# Patient Record
Sex: Female | Born: 1950
Health system: Southern US, Community
[De-identification: ages and names within clinical notes are randomized; demographics above are authoritative.]

## PROBLEM LIST (undated history)

## (undated) HISTORY — PX: ABDOMINAL HYSTERECTOMY: SHX81

---

## 1997-12-30 ENCOUNTER — Ambulatory Visit (HOSPITAL_COMMUNITY): Admission: RE | Admit: 1997-12-30 | Discharge: 1997-12-30 | Payer: Self-pay | Admitting: Obstetrics and Gynecology

## 1997-12-30 ENCOUNTER — Encounter: Payer: Self-pay | Admitting: Internal Medicine

## 1999-02-18 ENCOUNTER — Ambulatory Visit (HOSPITAL_COMMUNITY): Admission: RE | Admit: 1999-02-18 | Discharge: 1999-02-18 | Payer: Self-pay | Admitting: Internal Medicine

## 1999-02-18 ENCOUNTER — Encounter: Payer: Self-pay | Admitting: Internal Medicine

## 2000-08-19 ENCOUNTER — Ambulatory Visit (HOSPITAL_COMMUNITY): Admission: RE | Admit: 2000-08-19 | Discharge: 2000-08-19 | Payer: Self-pay | Admitting: General Surgery

## 2000-09-22 ENCOUNTER — Ambulatory Visit (HOSPITAL_COMMUNITY): Admission: RE | Admit: 2000-09-22 | Discharge: 2000-09-22 | Payer: Self-pay | Admitting: Unknown Physician Specialty

## 2000-09-22 ENCOUNTER — Encounter: Payer: Self-pay | Admitting: Unknown Physician Specialty

## 2004-07-30 ENCOUNTER — Ambulatory Visit (HOSPITAL_COMMUNITY): Admission: RE | Admit: 2004-07-30 | Discharge: 2004-07-30 | Payer: Self-pay | Admitting: Internal Medicine

## 2004-08-07 ENCOUNTER — Encounter: Admission: RE | Admit: 2004-08-07 | Discharge: 2004-08-07 | Payer: Self-pay | Admitting: *Deleted

## 2005-02-10 ENCOUNTER — Encounter: Admission: RE | Admit: 2005-02-10 | Discharge: 2005-02-10 | Payer: Self-pay | Admitting: *Deleted

## 2006-02-24 ENCOUNTER — Ambulatory Visit (HOSPITAL_COMMUNITY): Admission: RE | Admit: 2006-02-24 | Discharge: 2006-02-24 | Payer: Self-pay | Admitting: *Deleted

## 2007-03-22 ENCOUNTER — Ambulatory Visit (HOSPITAL_COMMUNITY): Admission: RE | Admit: 2007-03-22 | Discharge: 2007-03-22 | Payer: Self-pay | Admitting: *Deleted

## 2008-10-25 ENCOUNTER — Ambulatory Visit (HOSPITAL_COMMUNITY): Admission: RE | Admit: 2008-10-25 | Discharge: 2008-10-25 | Payer: Self-pay | Admitting: Family Medicine

## 2008-11-01 ENCOUNTER — Encounter: Admission: RE | Admit: 2008-11-01 | Discharge: 2008-11-01 | Payer: Self-pay | Admitting: Family Medicine

## 2009-11-25 ENCOUNTER — Encounter: Admission: RE | Admit: 2009-11-25 | Discharge: 2009-11-25 | Payer: Self-pay | Admitting: Neurosurgery

## 2010-01-02 ENCOUNTER — Ambulatory Visit (HOSPITAL_COMMUNITY)
Admission: RE | Admit: 2010-01-02 | Discharge: 2010-01-03 | Payer: Self-pay | Source: Home / Self Care | Attending: Neurosurgery | Admitting: Neurosurgery

## 2010-03-31 LAB — CBC
HCT: 41.9 % (ref 36.0–46.0)
Hemoglobin: 14 g/dL (ref 12.0–15.0)
MCH: 29.7 pg (ref 26.0–34.0)
MCV: 89 fL (ref 78.0–100.0)
WBC: 11.1 10*3/uL — ABNORMAL HIGH (ref 4.0–10.5)

## 2010-03-31 LAB — BASIC METABOLIC PANEL
BUN: 9 mg/dL (ref 6–23)
Calcium: 9.5 mg/dL (ref 8.4–10.5)
Creatinine, Ser: 0.91 mg/dL (ref 0.4–1.2)
GFR calc Af Amer: >60 mL/min (ref >60)

## 2011-02-24 ENCOUNTER — Other Ambulatory Visit: Payer: Self-pay | Admitting: Family Medicine

## 2011-02-24 DIAGNOSIS — Z1231 Encounter for screening mammogram for malignant neoplasm of breast: Secondary | ICD-10-CM

## 2011-03-17 ENCOUNTER — Ambulatory Visit
Admission: RE | Admit: 2011-03-17 | Discharge: 2011-03-17 | Disposition: A | Discharge status: Home / Self Care | Payer: BC Managed Care – PPO | Source: Ambulatory Visit | Attending: Family Medicine | Admitting: Family Medicine

## 2011-03-17 DIAGNOSIS — Z1231 Encounter for screening mammogram for malignant neoplasm of breast: Secondary | ICD-10-CM

## 2012-06-20 ENCOUNTER — Other Ambulatory Visit: Payer: Self-pay

## 2012-06-20 DIAGNOSIS — Z1231 Encounter for screening mammogram for malignant neoplasm of breast: Secondary | ICD-10-CM

## 2012-07-18 ENCOUNTER — Ambulatory Visit
Admission: RE | Admit: 2012-07-18 | Discharge: 2012-07-18 | Disposition: A | Discharge status: Home / Self Care | Payer: BC Managed Care – PPO | Source: Ambulatory Visit

## 2012-07-18 DIAGNOSIS — Z1231 Encounter for screening mammogram for malignant neoplasm of breast: Secondary | ICD-10-CM

## 2013-08-13 ENCOUNTER — Other Ambulatory Visit (HOSPITAL_COMMUNITY): Payer: Self-pay | Admitting: Family Medicine

## 2013-08-13 DIAGNOSIS — Z139 Encounter for screening, unspecified: Secondary | ICD-10-CM

## 2013-08-15 ENCOUNTER — Ambulatory Visit (HOSPITAL_COMMUNITY)
Admission: RE | Admit: 2013-08-15 | Discharge: 2013-08-15 | Disposition: A | Discharge status: Home / Self Care | Payer: BC Managed Care – PPO | Source: Ambulatory Visit | Attending: Family Medicine | Admitting: Family Medicine

## 2013-08-15 DIAGNOSIS — Z139 Encounter for screening, unspecified: Secondary | ICD-10-CM

## 2013-08-15 DIAGNOSIS — Z1231 Encounter for screening mammogram for malignant neoplasm of breast: Secondary | ICD-10-CM | POA: Insufficient documentation

## 2015-04-30 DIAGNOSIS — N183 Chronic kidney disease, stage 3 (moderate): Secondary | ICD-10-CM | POA: Diagnosis not present

## 2015-04-30 DIAGNOSIS — G4733 Obstructive sleep apnea (adult) (pediatric): Secondary | ICD-10-CM | POA: Diagnosis not present

## 2015-04-30 DIAGNOSIS — I10 Essential (primary) hypertension: Secondary | ICD-10-CM | POA: Diagnosis not present

## 2015-04-30 DIAGNOSIS — E782 Mixed hyperlipidemia: Secondary | ICD-10-CM | POA: Diagnosis not present

## 2015-05-06 DIAGNOSIS — G4733 Obstructive sleep apnea (adult) (pediatric): Secondary | ICD-10-CM | POA: Diagnosis not present

## 2015-05-06 DIAGNOSIS — E782 Mixed hyperlipidemia: Secondary | ICD-10-CM | POA: Diagnosis not present

## 2015-05-06 DIAGNOSIS — I10 Essential (primary) hypertension: Secondary | ICD-10-CM | POA: Diagnosis not present

## 2015-05-06 DIAGNOSIS — N183 Chronic kidney disease, stage 3 (moderate): Secondary | ICD-10-CM | POA: Diagnosis not present

## 2015-05-08 DIAGNOSIS — Z13820 Encounter for screening for osteoporosis: Secondary | ICD-10-CM | POA: Diagnosis not present

## 2015-05-08 DIAGNOSIS — Z79899 Other long term (current) drug therapy: Secondary | ICD-10-CM | POA: Diagnosis not present

## 2015-05-08 DIAGNOSIS — Z780 Asymptomatic menopausal state: Secondary | ICD-10-CM | POA: Diagnosis not present

## 2015-05-08 DIAGNOSIS — I10 Essential (primary) hypertension: Secondary | ICD-10-CM | POA: Diagnosis not present

## 2015-05-08 DIAGNOSIS — M810 Age-related osteoporosis without current pathological fracture: Secondary | ICD-10-CM | POA: Diagnosis not present

## 2015-05-09 DIAGNOSIS — Z1231 Encounter for screening mammogram for malignant neoplasm of breast: Secondary | ICD-10-CM | POA: Diagnosis not present

## 2015-05-30 DIAGNOSIS — I10 Essential (primary) hypertension: Secondary | ICD-10-CM | POA: Diagnosis not present

## 2015-05-30 DIAGNOSIS — R7301 Impaired fasting glucose: Secondary | ICD-10-CM | POA: Diagnosis not present

## 2015-05-30 DIAGNOSIS — E782 Mixed hyperlipidemia: Secondary | ICD-10-CM | POA: Diagnosis not present

## 2015-05-30 DIAGNOSIS — N183 Chronic kidney disease, stage 3 (moderate): Secondary | ICD-10-CM | POA: Diagnosis not present

## 2015-06-05 DIAGNOSIS — Z0001 Encounter for general adult medical examination with abnormal findings: Secondary | ICD-10-CM | POA: Diagnosis not present

## 2015-06-05 DIAGNOSIS — Z1212 Encounter for screening for malignant neoplasm of rectum: Secondary | ICD-10-CM | POA: Diagnosis not present

## 2015-06-05 DIAGNOSIS — I10 Essential (primary) hypertension: Secondary | ICD-10-CM | POA: Diagnosis not present

## 2015-06-05 DIAGNOSIS — Z23 Encounter for immunization: Secondary | ICD-10-CM | POA: Diagnosis not present

## 2015-06-05 DIAGNOSIS — E782 Mixed hyperlipidemia: Secondary | ICD-10-CM | POA: Diagnosis not present

## 2015-06-05 DIAGNOSIS — G4733 Obstructive sleep apnea (adult) (pediatric): Secondary | ICD-10-CM | POA: Diagnosis not present

## 2015-11-26 DIAGNOSIS — Z23 Encounter for immunization: Secondary | ICD-10-CM | POA: Diagnosis not present

## 2015-11-26 DIAGNOSIS — S5002XA Contusion of left elbow, initial encounter: Secondary | ICD-10-CM | POA: Diagnosis not present

## 2015-11-26 DIAGNOSIS — S52042A Displaced fracture of coronoid process of left ulna, initial encounter for closed fracture: Secondary | ICD-10-CM | POA: Diagnosis not present

## 2015-11-26 DIAGNOSIS — S52022A Displaced fracture of olecranon process without intraarticular extension of left ulna, initial encounter for closed fracture: Secondary | ICD-10-CM | POA: Diagnosis not present

## 2015-12-02 DIAGNOSIS — S52045A Nondisplaced fracture of coronoid process of left ulna, initial encounter for closed fracture: Secondary | ICD-10-CM | POA: Diagnosis not present

## 2016-04-26 DIAGNOSIS — Z23 Encounter for immunization: Secondary | ICD-10-CM | POA: Diagnosis not present

## 2016-04-26 DIAGNOSIS — N183 Chronic kidney disease, stage 3 (moderate): Secondary | ICD-10-CM | POA: Diagnosis not present

## 2016-04-26 DIAGNOSIS — I10 Essential (primary) hypertension: Secondary | ICD-10-CM | POA: Diagnosis not present

## 2016-04-26 DIAGNOSIS — G4733 Obstructive sleep apnea (adult) (pediatric): Secondary | ICD-10-CM | POA: Diagnosis not present

## 2016-04-26 DIAGNOSIS — M545 Low back pain: Secondary | ICD-10-CM | POA: Diagnosis not present

## 2016-04-26 DIAGNOSIS — E782 Mixed hyperlipidemia: Secondary | ICD-10-CM | POA: Diagnosis not present

## 2016-06-01 DIAGNOSIS — C44511 Basal cell carcinoma of skin of breast: Secondary | ICD-10-CM | POA: Diagnosis not present

## 2016-06-01 DIAGNOSIS — C44509 Unspecified malignant neoplasm of skin of other part of trunk: Secondary | ICD-10-CM | POA: Diagnosis not present

## 2016-07-20 DIAGNOSIS — R7301 Impaired fasting glucose: Secondary | ICD-10-CM | POA: Diagnosis not present

## 2016-07-20 DIAGNOSIS — N183 Chronic kidney disease, stage 3 (moderate): Secondary | ICD-10-CM | POA: Diagnosis not present

## 2016-07-20 DIAGNOSIS — M545 Low back pain: Secondary | ICD-10-CM | POA: Diagnosis not present

## 2016-07-20 DIAGNOSIS — E782 Mixed hyperlipidemia: Secondary | ICD-10-CM | POA: Diagnosis not present

## 2016-07-20 DIAGNOSIS — Z1389 Encounter for screening for other disorder: Secondary | ICD-10-CM | POA: Diagnosis not present

## 2016-07-20 DIAGNOSIS — I10 Essential (primary) hypertension: Secondary | ICD-10-CM | POA: Diagnosis not present

## 2016-07-29 DIAGNOSIS — Z1231 Encounter for screening mammogram for malignant neoplasm of breast: Secondary | ICD-10-CM | POA: Diagnosis not present

## 2016-11-08 DIAGNOSIS — N183 Chronic kidney disease, stage 3 (moderate): Secondary | ICD-10-CM | POA: Diagnosis not present

## 2016-11-08 DIAGNOSIS — E782 Mixed hyperlipidemia: Secondary | ICD-10-CM | POA: Diagnosis not present

## 2016-11-08 DIAGNOSIS — Z0001 Encounter for general adult medical examination with abnormal findings: Secondary | ICD-10-CM | POA: Diagnosis not present

## 2016-11-08 DIAGNOSIS — M545 Low back pain: Secondary | ICD-10-CM | POA: Diagnosis not present

## 2016-11-08 DIAGNOSIS — Z9189 Other specified personal risk factors, not elsewhere classified: Secondary | ICD-10-CM | POA: Diagnosis not present

## 2016-11-08 DIAGNOSIS — Z23 Encounter for immunization: Secondary | ICD-10-CM | POA: Diagnosis not present

## 2017-06-20 DIAGNOSIS — R7301 Impaired fasting glucose: Secondary | ICD-10-CM | POA: Diagnosis not present

## 2017-06-20 DIAGNOSIS — N183 Chronic kidney disease, stage 3 (moderate): Secondary | ICD-10-CM | POA: Diagnosis not present

## 2017-06-20 DIAGNOSIS — E782 Mixed hyperlipidemia: Secondary | ICD-10-CM | POA: Diagnosis not present

## 2017-06-20 DIAGNOSIS — Z79891 Long term (current) use of opiate analgesic: Secondary | ICD-10-CM | POA: Diagnosis not present

## 2017-06-20 DIAGNOSIS — I10 Essential (primary) hypertension: Secondary | ICD-10-CM | POA: Diagnosis not present

## 2017-06-20 DIAGNOSIS — Z1389 Encounter for screening for other disorder: Secondary | ICD-10-CM | POA: Diagnosis not present

## 2017-06-21 DIAGNOSIS — Z79891 Long term (current) use of opiate analgesic: Secondary | ICD-10-CM | POA: Diagnosis not present

## 2017-11-11 DIAGNOSIS — R7301 Impaired fasting glucose: Secondary | ICD-10-CM | POA: Diagnosis not present

## 2017-11-11 DIAGNOSIS — I10 Essential (primary) hypertension: Secondary | ICD-10-CM | POA: Diagnosis not present

## 2017-11-11 DIAGNOSIS — Z0001 Encounter for general adult medical examination with abnormal findings: Secondary | ICD-10-CM | POA: Diagnosis not present

## 2017-11-11 DIAGNOSIS — Z79891 Long term (current) use of opiate analgesic: Secondary | ICD-10-CM | POA: Diagnosis not present

## 2017-11-11 DIAGNOSIS — E782 Mixed hyperlipidemia: Secondary | ICD-10-CM | POA: Diagnosis not present

## 2017-11-11 DIAGNOSIS — N183 Chronic kidney disease, stage 3 (moderate): Secondary | ICD-10-CM | POA: Diagnosis not present

## 2017-11-14 DIAGNOSIS — Z0001 Encounter for general adult medical examination with abnormal findings: Secondary | ICD-10-CM | POA: Diagnosis not present

## 2017-11-14 DIAGNOSIS — N183 Chronic kidney disease, stage 3 (moderate): Secondary | ICD-10-CM | POA: Diagnosis not present

## 2017-11-14 DIAGNOSIS — I10 Essential (primary) hypertension: Secondary | ICD-10-CM | POA: Diagnosis not present

## 2017-11-14 DIAGNOSIS — E782 Mixed hyperlipidemia: Secondary | ICD-10-CM | POA: Diagnosis not present

## 2017-11-14 DIAGNOSIS — R7301 Impaired fasting glucose: Secondary | ICD-10-CM | POA: Diagnosis not present

## 2017-12-27 ENCOUNTER — Encounter: Payer: Self-pay | Admitting: General Surgery

## 2017-12-27 ENCOUNTER — Ambulatory Visit: Payer: Medicare Other | Admitting: General Surgery

## 2017-12-27 VITALS — BP 147/73 | HR 80 | Temp 98.0°F | Resp 18 | Wt 194.8 lb

## 2017-12-27 DIAGNOSIS — Z1211 Encounter for screening for malignant neoplasm of colon: Secondary | ICD-10-CM

## 2017-12-27 MED ORDER — PEG 3350-KCL-NABCB-NACL-NASULF 236 G PO SOLR: 4000.0000 mL | Freq: Once | ORAL | 0 refills | Status: AC

## 2017-12-27 NOTE — Progress Notes (Signed)
Taylor Warner; [id number]; Dec 04, 1950   HPI Patient is a 67 year old white female who was referred to my care by Dr. Quillian Quince for a screening colonoscopy.  Patient has had a colonoscopy in the remote past which polyp.  It was removed.  She has no family history of colon cancer.  She denies any recent abdominal pain, weight loss, diarrhea, constipation, or blood in her stools.  She currently has 0 out of 10 abdominal pain. History reviewed. No pertinent past medical history.  History reviewed. No pertinent surgical history.  History reviewed. No pertinent family history.  Current Outpatient Medications on File Prior to Visit  Medication Sig Dispense Refill  . buPROPion (WELLBUTRIN XL) 300 MG 24 hr tablet Take 300 mg by mouth daily.    . DULoxetine (CYMBALTA) 60 MG capsule Take 60 mg by mouth daily.    Marland Kitchen HYDROcodone-acetaminophen (NORCO/VICODIN) 5-325 MG tablet Take 1 tablet by mouth every 6 (six) hours as needed for moderate pain.    Marland Kitchen lisinopril-hydrochlorothiazide (PRINZIDE,ZESTORETIC) 10-12.5 MG tablet Take 1 tablet by mouth daily.    . traZODone (DESYREL) 50 MG tablet Take 50 mg by mouth at bedtime.     No current facility-administered medications on file prior to visit.     No Known Allergies  Social History   Substance and Sexual Activity  Alcohol Use Not on file    Social History   Tobacco Use  Smoking Status Former Smoker  Smokeless Tobacco Never Used    Review of Systems  Constitutional: Negative.   HENT: Negative.   Eyes: Negative.   Respiratory: Negative.   Cardiovascular: Negative.   Gastrointestinal: Negative.   Genitourinary: Negative.   Musculoskeletal: Negative.   Skin: Negative.   Neurological: Negative.   Endo/Heme/Allergies: Negative.   Psychiatric/Behavioral: Negative.     Objective   Vitals:   12/27/17 1314  BP: (!) 147/73  Pulse: 80  Resp: 18  Temp: 98 F (36.7 C)    Physical Exam  Constitutional: She is oriented to person, place, and  time. She appears well-developed and well-nourished. No distress.  HENT:  Head: Normocephalic and atraumatic.  Cardiovascular: Normal rate, regular rhythm and normal heart sounds. Exam reveals no gallop and no friction rub.  No murmur heard. Pulmonary/Chest: Effort normal and breath sounds normal. No stridor. No respiratory distress. She has no wheezes. She has no rales.  Abdominal: Soft. Bowel sounds are normal. She exhibits no distension and no mass. There is no tenderness. There is no rebound and no guarding.  Neurological: She is oriented to person, place, and time.  Skin: Skin is warm and dry.  Vitals reviewed. Dr. Olena Heckle notes reviewed  Assessment  Need for screening colonoscopy Plan   Patient will call to schedule her colonoscopy in January of 2020.  The risks and benefits of the procedure including bleeding and perforation were fully explained to the patient, who gave informed consent.

## 2017-12-27 NOTE — Patient Instructions (Signed)
Colonoscopy, Adult A colonoscopy is an exam to look at the entire large intestine. During the exam, a lubricated, bendable tube is inserted into the anus and then passed into the rectum, colon, and other parts of the large intestine. A colonoscopy is often done as a part of normal colorectal screening or in response to certain symptoms, such as anemia, persistent diarrhea, abdominal pain, and blood in the stool. The exam can help screen for and diagnose medical problems, including:  Tumors.  Polyps.  Inflammation.  Areas of bleeding.  Tell a health care provider about:  Any allergies you have.  All medicines you are taking, including vitamins, herbs, eye drops, creams, and over-the-counter medicines.  Any problems you or family members have had with anesthetic medicines.  Any blood disorders you have.  Any surgeries you have had.  Any medical conditions you have.  Any problems you have had passing stool. What are the risks? Generally, this is a safe procedure. However, problems may occur, including:  Bleeding.  A tear in the intestine.  A reaction to medicines given during the exam.  Infection (rare).  What happens before the procedure? Eating and drinking restrictions Follow instructions from your health care provider about eating and drinking, which may include:  A few days before the procedure - follow a low-fiber diet. Avoid nuts, seeds, dried fruit, raw fruits, and vegetables.  1-3 days before the procedure - follow a clear liquid diet. Drink only clear liquids, such as clear broth or bouillon, black coffee or tea, clear juice, clear soft drinks or sports drinks, gelatin dessert, and popsicles. Avoid any liquids that contain red or purple dye.  On the day of the procedure - do not eat or drink anything during the 2 hours before the procedure, or within the time period that your health care provider recommends.  Bowel prep If you were prescribed an oral bowel prep  to clean out your colon:  Take it as told by your health care provider. Starting the day before your procedure, you will need to drink a large amount of medicated liquid. The liquid will cause you to have multiple loose stools until your stool is almost clear or light green.  If your skin or anus gets irritated from diarrhea, you may use these to relieve the irritation: ? Medicated wipes, such as adult wet wipes with aloe and vitamin E. ? A skin soothing-product like petroleum jelly.  If you vomit while drinking the bowel prep, take a break for up to 60 minutes and then begin the bowel prep again. If vomiting continues and you cannot take the bowel prep without vomiting, call your health care provider.  General instructions  Ask your health care provider about changing or stopping your regular medicines. This is especially important if you are taking diabetes medicines or blood thinners.  Plan to have someone take you home from the hospital or clinic. What happens during the procedure?  An IV tube may be inserted into one of your veins.  You will be given medicine to help you relax (sedative).  To reduce your risk of infection: ? Your health care team will wash or sanitize their hands. ? Your anal area will be washed with soap.  You will be asked to lie on your side with your knees bent.  Your health care provider will lubricate a long, thin, flexible tube. The tube will have a camera and a light on the end.  The tube will be inserted into your   anus.  The tube will be gently eased through your rectum and colon.  Air will be delivered into your colon to keep it open. You may feel some pressure or cramping.  The camera will be used to take images during the procedure.  A small tissue sample may be removed from your body to be examined under a microscope (biopsy). If any potential problems are found, the tissue will be sent to a lab for testing.  If small polyps are found, your  health care provider may remove them and have them checked for cancer cells.  The tube that was inserted into your anus will be slowly removed. The procedure may vary among health care providers and hospitals. What happens after the procedure?  Your blood pressure, heart rate, breathing rate, and blood oxygen level will be monitored until the medicines you were given have worn off.  Do not drive for 24 hours after the exam.  You may have a small amount of blood in your stool.  You may pass gas and have mild abdominal cramping or bloating due to the air that was used to inflate your colon during the exam.  It is up to you to get the results of your procedure. Ask your health care provider, or the department performing the procedure, when your results will be ready. This information is not intended to replace advice given to you by your health care provider. Make sure you discuss any questions you have with your health care provider. Document Released: 01/02/2000 Document Revised: 11/05/2015 Document Reviewed: 03/18/2015 Elsevier Interactive Patient Education  2018 [employer]  

## 2018-02-15 DIAGNOSIS — E782 Mixed hyperlipidemia: Secondary | ICD-10-CM | POA: Diagnosis not present

## 2018-02-15 DIAGNOSIS — I10 Essential (primary) hypertension: Secondary | ICD-10-CM | POA: Diagnosis not present

## 2018-02-15 DIAGNOSIS — N183 Chronic kidney disease, stage 3 (moderate): Secondary | ICD-10-CM | POA: Diagnosis not present

## 2018-02-15 DIAGNOSIS — R7301 Impaired fasting glucose: Secondary | ICD-10-CM | POA: Diagnosis not present

## 2018-02-15 DIAGNOSIS — Z1389 Encounter for screening for other disorder: Secondary | ICD-10-CM | POA: Diagnosis not present

## 2018-05-12 DIAGNOSIS — E782 Mixed hyperlipidemia: Secondary | ICD-10-CM | POA: Diagnosis not present

## 2018-05-12 DIAGNOSIS — I10 Essential (primary) hypertension: Secondary | ICD-10-CM | POA: Diagnosis not present

## 2018-05-12 DIAGNOSIS — R7301 Impaired fasting glucose: Secondary | ICD-10-CM | POA: Diagnosis not present

## 2018-05-12 DIAGNOSIS — G4733 Obstructive sleep apnea (adult) (pediatric): Secondary | ICD-10-CM | POA: Diagnosis not present

## 2018-05-16 DIAGNOSIS — E782 Mixed hyperlipidemia: Secondary | ICD-10-CM | POA: Diagnosis not present

## 2018-05-16 DIAGNOSIS — I10 Essential (primary) hypertension: Secondary | ICD-10-CM | POA: Diagnosis not present

## 2018-05-16 DIAGNOSIS — R7301 Impaired fasting glucose: Secondary | ICD-10-CM | POA: Diagnosis not present

## 2018-05-16 DIAGNOSIS — N183 Chronic kidney disease, stage 3 (moderate): Secondary | ICD-10-CM | POA: Diagnosis not present

## 2018-11-14 DIAGNOSIS — E349 Endocrine disorder, unspecified: Secondary | ICD-10-CM | POA: Diagnosis not present

## 2018-11-14 DIAGNOSIS — Z79891 Long term (current) use of opiate analgesic: Secondary | ICD-10-CM | POA: Diagnosis not present

## 2018-11-14 DIAGNOSIS — I10 Essential (primary) hypertension: Secondary | ICD-10-CM | POA: Diagnosis not present

## 2018-11-14 DIAGNOSIS — R7309 Other abnormal glucose: Secondary | ICD-10-CM | POA: Diagnosis not present

## 2018-11-14 DIAGNOSIS — R7301 Impaired fasting glucose: Secondary | ICD-10-CM | POA: Diagnosis not present

## 2018-11-14 DIAGNOSIS — E782 Mixed hyperlipidemia: Secondary | ICD-10-CM | POA: Diagnosis not present

## 2018-11-17 ENCOUNTER — Other Ambulatory Visit (HOSPITAL_COMMUNITY): Payer: Self-pay | Admitting: Family Medicine

## 2018-11-17 DIAGNOSIS — N183 Chronic kidney disease, stage 3 unspecified: Secondary | ICD-10-CM | POA: Diagnosis not present

## 2018-11-17 DIAGNOSIS — Z23 Encounter for immunization: Secondary | ICD-10-CM | POA: Diagnosis not present

## 2018-11-17 DIAGNOSIS — R7301 Impaired fasting glucose: Secondary | ICD-10-CM | POA: Diagnosis not present

## 2018-11-17 DIAGNOSIS — M545 Low back pain: Secondary | ICD-10-CM | POA: Diagnosis not present

## 2018-11-17 DIAGNOSIS — Z0001 Encounter for general adult medical examination with abnormal findings: Secondary | ICD-10-CM | POA: Diagnosis not present

## 2018-11-17 DIAGNOSIS — N189 Chronic kidney disease, unspecified: Secondary | ICD-10-CM | POA: Diagnosis not present

## 2018-11-17 DIAGNOSIS — I10 Essential (primary) hypertension: Secondary | ICD-10-CM | POA: Diagnosis not present

## 2018-11-17 DIAGNOSIS — Z1231 Encounter for screening mammogram for malignant neoplasm of breast: Secondary | ICD-10-CM

## 2018-11-23 DIAGNOSIS — C44319 Basal cell carcinoma of skin of other parts of face: Secondary | ICD-10-CM | POA: Diagnosis not present

## 2018-11-24 DIAGNOSIS — C44311 Basal cell carcinoma of skin of nose: Secondary | ICD-10-CM | POA: Diagnosis not present

## 2018-11-29 ENCOUNTER — Ambulatory Visit (HOSPITAL_COMMUNITY)
Admission: RE | Admit: 2018-11-29 | Discharge: 2018-11-29 | Disposition: A | Discharge status: Home / Self Care | Payer: Medicare Other | Source: Ambulatory Visit | Attending: Family Medicine | Admitting: Family Medicine

## 2018-11-29 ENCOUNTER — Other Ambulatory Visit: Payer: Self-pay

## 2018-11-29 DIAGNOSIS — Z1231 Encounter for screening mammogram for malignant neoplasm of breast: Secondary | ICD-10-CM

## 2018-12-04 DIAGNOSIS — Z1212 Encounter for screening for malignant neoplasm of rectum: Secondary | ICD-10-CM | POA: Diagnosis not present

## 2018-12-04 DIAGNOSIS — Z1211 Encounter for screening for malignant neoplasm of colon: Secondary | ICD-10-CM | POA: Diagnosis not present

## 2018-12-26 ENCOUNTER — Other Ambulatory Visit: Payer: Self-pay

## 2018-12-26 NOTE — Patient Outreach (Signed)
[facility]) Care Management  12/26/2018  Taylor Warner 11/15/1950 [id number]   Medication Adherence call to Taylor Warner Telephone call to Patient regarding Medication Adherence unable to reach patient. Taylor Warner is past due on Lisinopril/Hctz 10/12.5 mg under [facility].    [place] Management Direct Dial [phone]  Fax [phone] [email or web address]

## 2019-01-18 DIAGNOSIS — I10 Essential (primary) hypertension: Secondary | ICD-10-CM | POA: Diagnosis not present

## 2019-01-18 DIAGNOSIS — E782 Mixed hyperlipidemia: Secondary | ICD-10-CM | POA: Diagnosis not present

## 2019-02-16 DIAGNOSIS — G4733 Obstructive sleep apnea (adult) (pediatric): Secondary | ICD-10-CM | POA: Diagnosis not present

## 2019-02-16 DIAGNOSIS — I10 Essential (primary) hypertension: Secondary | ICD-10-CM | POA: Diagnosis not present

## 2019-02-16 DIAGNOSIS — E7849 Other hyperlipidemia: Secondary | ICD-10-CM | POA: Diagnosis not present

## 2019-02-20 DIAGNOSIS — I10 Essential (primary) hypertension: Secondary | ICD-10-CM | POA: Diagnosis not present

## 2019-02-20 DIAGNOSIS — N183 Chronic kidney disease, stage 3 unspecified: Secondary | ICD-10-CM | POA: Diagnosis not present

## 2019-02-20 DIAGNOSIS — R739 Hyperglycemia, unspecified: Secondary | ICD-10-CM | POA: Diagnosis not present

## 2019-02-20 DIAGNOSIS — E782 Mixed hyperlipidemia: Secondary | ICD-10-CM | POA: Diagnosis not present

## 2019-03-16 DIAGNOSIS — I10 Essential (primary) hypertension: Secondary | ICD-10-CM | POA: Diagnosis not present

## 2019-03-16 DIAGNOSIS — E7849 Other hyperlipidemia: Secondary | ICD-10-CM | POA: Diagnosis not present

## 2019-04-18 DIAGNOSIS — I10 Essential (primary) hypertension: Secondary | ICD-10-CM | POA: Diagnosis not present

## 2019-04-18 DIAGNOSIS — E7849 Other hyperlipidemia: Secondary | ICD-10-CM | POA: Diagnosis not present

## 2019-05-18 DIAGNOSIS — E7849 Other hyperlipidemia: Secondary | ICD-10-CM | POA: Diagnosis not present

## 2019-05-18 DIAGNOSIS — I10 Essential (primary) hypertension: Secondary | ICD-10-CM | POA: Diagnosis not present

## 2019-07-05 DIAGNOSIS — I10 Essential (primary) hypertension: Secondary | ICD-10-CM | POA: Diagnosis not present

## 2019-07-05 DIAGNOSIS — N183 Chronic kidney disease, stage 3 unspecified: Secondary | ICD-10-CM | POA: Diagnosis not present

## 2019-07-05 DIAGNOSIS — E782 Mixed hyperlipidemia: Secondary | ICD-10-CM | POA: Diagnosis not present

## 2019-07-05 DIAGNOSIS — R7301 Impaired fasting glucose: Secondary | ICD-10-CM | POA: Diagnosis not present

## 2019-07-10 DIAGNOSIS — E1122 Type 2 diabetes mellitus with diabetic chronic kidney disease: Secondary | ICD-10-CM | POA: Diagnosis not present

## 2019-07-10 DIAGNOSIS — I10 Essential (primary) hypertension: Secondary | ICD-10-CM | POA: Diagnosis not present

## 2019-07-10 DIAGNOSIS — M545 Low back pain: Secondary | ICD-10-CM | POA: Diagnosis not present

## 2019-07-10 DIAGNOSIS — N183 Chronic kidney disease, stage 3 unspecified: Secondary | ICD-10-CM | POA: Diagnosis not present

## 2019-07-10 DIAGNOSIS — Z79891 Long term (current) use of opiate analgesic: Secondary | ICD-10-CM | POA: Diagnosis not present

## 2019-07-10 DIAGNOSIS — Z1389 Encounter for screening for other disorder: Secondary | ICD-10-CM | POA: Diagnosis not present

## 2019-07-10 DIAGNOSIS — E782 Mixed hyperlipidemia: Secondary | ICD-10-CM | POA: Diagnosis not present

## 2019-07-27 DIAGNOSIS — R270 Ataxia, unspecified: Secondary | ICD-10-CM | POA: Diagnosis not present

## 2019-07-27 DIAGNOSIS — R2689 Other abnormalities of gait and mobility: Secondary | ICD-10-CM | POA: Diagnosis not present

## 2019-08-17 DIAGNOSIS — N183 Chronic kidney disease, stage 3 unspecified: Secondary | ICD-10-CM | POA: Diagnosis not present

## 2019-08-17 DIAGNOSIS — I129 Hypertensive chronic kidney disease with stage 1 through stage 4 chronic kidney disease, or unspecified chronic kidney disease: Secondary | ICD-10-CM | POA: Diagnosis not present

## 2019-08-17 DIAGNOSIS — E1122 Type 2 diabetes mellitus with diabetic chronic kidney disease: Secondary | ICD-10-CM | POA: Diagnosis not present

## 2019-08-17 DIAGNOSIS — E7849 Other hyperlipidemia: Secondary | ICD-10-CM | POA: Diagnosis not present

## 2019-10-31 DIAGNOSIS — E782 Mixed hyperlipidemia: Secondary | ICD-10-CM | POA: Diagnosis not present

## 2019-10-31 DIAGNOSIS — I10 Essential (primary) hypertension: Secondary | ICD-10-CM | POA: Diagnosis not present

## 2019-10-31 DIAGNOSIS — E1122 Type 2 diabetes mellitus with diabetic chronic kidney disease: Secondary | ICD-10-CM | POA: Diagnosis not present

## 2019-10-31 DIAGNOSIS — N183 Chronic kidney disease, stage 3 unspecified: Secondary | ICD-10-CM | POA: Diagnosis not present

## 2019-11-05 DIAGNOSIS — E782 Mixed hyperlipidemia: Secondary | ICD-10-CM | POA: Diagnosis not present

## 2019-11-05 DIAGNOSIS — Z0001 Encounter for general adult medical examination with abnormal findings: Secondary | ICD-10-CM | POA: Diagnosis not present

## 2019-11-05 DIAGNOSIS — Z79891 Long term (current) use of opiate analgesic: Secondary | ICD-10-CM | POA: Diagnosis not present

## 2019-11-05 DIAGNOSIS — R4582 Worries: Secondary | ICD-10-CM | POA: Diagnosis not present

## 2019-11-05 DIAGNOSIS — I10 Essential (primary) hypertension: Secondary | ICD-10-CM | POA: Diagnosis not present

## 2020-01-29 DIAGNOSIS — Z1329 Encounter for screening for other suspected endocrine disorder: Secondary | ICD-10-CM | POA: Diagnosis not present

## 2020-01-29 DIAGNOSIS — I10 Essential (primary) hypertension: Secondary | ICD-10-CM | POA: Diagnosis not present

## 2020-01-29 DIAGNOSIS — R7301 Impaired fasting glucose: Secondary | ICD-10-CM | POA: Diagnosis not present

## 2020-01-29 DIAGNOSIS — E1122 Type 2 diabetes mellitus with diabetic chronic kidney disease: Secondary | ICD-10-CM | POA: Diagnosis not present

## 2020-01-29 DIAGNOSIS — E782 Mixed hyperlipidemia: Secondary | ICD-10-CM | POA: Diagnosis not present

## 2020-01-29 DIAGNOSIS — R946 Abnormal results of thyroid function studies: Secondary | ICD-10-CM | POA: Diagnosis not present

## 2020-01-29 DIAGNOSIS — E7849 Other hyperlipidemia: Secondary | ICD-10-CM | POA: Diagnosis not present

## 2020-02-04 DIAGNOSIS — Z79891 Long term (current) use of opiate analgesic: Secondary | ICD-10-CM | POA: Diagnosis not present

## 2020-02-04 DIAGNOSIS — E1122 Type 2 diabetes mellitus with diabetic chronic kidney disease: Secondary | ICD-10-CM | POA: Diagnosis not present

## 2020-02-04 DIAGNOSIS — I10 Essential (primary) hypertension: Secondary | ICD-10-CM | POA: Diagnosis not present

## 2020-02-04 DIAGNOSIS — R4582 Worries: Secondary | ICD-10-CM | POA: Diagnosis not present

## 2020-02-04 DIAGNOSIS — E7849 Other hyperlipidemia: Secondary | ICD-10-CM | POA: Diagnosis not present

## 2020-05-01 DIAGNOSIS — E7849 Other hyperlipidemia: Secondary | ICD-10-CM | POA: Diagnosis not present

## 2020-05-01 DIAGNOSIS — N183 Chronic kidney disease, stage 3 unspecified: Secondary | ICD-10-CM | POA: Diagnosis not present

## 2020-05-01 DIAGNOSIS — I10 Essential (primary) hypertension: Secondary | ICD-10-CM | POA: Diagnosis not present

## 2020-05-01 DIAGNOSIS — E1122 Type 2 diabetes mellitus with diabetic chronic kidney disease: Secondary | ICD-10-CM | POA: Diagnosis not present

## 2020-05-01 DIAGNOSIS — Z1329 Encounter for screening for other suspected endocrine disorder: Secondary | ICD-10-CM | POA: Diagnosis not present

## 2020-05-01 DIAGNOSIS — E782 Mixed hyperlipidemia: Secondary | ICD-10-CM | POA: Diagnosis not present

## 2020-05-05 DIAGNOSIS — R4582 Worries: Secondary | ICD-10-CM | POA: Diagnosis not present

## 2020-05-05 DIAGNOSIS — I10 Essential (primary) hypertension: Secondary | ICD-10-CM | POA: Diagnosis not present

## 2020-05-05 DIAGNOSIS — G4733 Obstructive sleep apnea (adult) (pediatric): Secondary | ICD-10-CM | POA: Diagnosis not present

## 2020-05-05 DIAGNOSIS — E1122 Type 2 diabetes mellitus with diabetic chronic kidney disease: Secondary | ICD-10-CM | POA: Diagnosis not present

## 2020-05-05 DIAGNOSIS — E7849 Other hyperlipidemia: Secondary | ICD-10-CM | POA: Diagnosis not present

## 2020-05-05 DIAGNOSIS — Z79891 Long term (current) use of opiate analgesic: Secondary | ICD-10-CM | POA: Diagnosis not present

## 2020-05-05 DIAGNOSIS — M545 Low back pain, unspecified: Secondary | ICD-10-CM | POA: Diagnosis not present

## 2020-11-03 DIAGNOSIS — E1122 Type 2 diabetes mellitus with diabetic chronic kidney disease: Secondary | ICD-10-CM | POA: Diagnosis not present

## 2020-11-03 DIAGNOSIS — N183 Chronic kidney disease, stage 3 unspecified: Secondary | ICD-10-CM | POA: Diagnosis not present

## 2020-11-03 DIAGNOSIS — I10 Essential (primary) hypertension: Secondary | ICD-10-CM | POA: Diagnosis not present

## 2020-11-03 DIAGNOSIS — E782 Mixed hyperlipidemia: Secondary | ICD-10-CM | POA: Diagnosis not present

## 2020-11-03 DIAGNOSIS — E7849 Other hyperlipidemia: Secondary | ICD-10-CM | POA: Diagnosis not present

## 2020-11-03 DIAGNOSIS — R7301 Impaired fasting glucose: Secondary | ICD-10-CM | POA: Diagnosis not present

## 2020-11-06 DIAGNOSIS — I10 Essential (primary) hypertension: Secondary | ICD-10-CM | POA: Diagnosis not present

## 2020-11-06 DIAGNOSIS — E7849 Other hyperlipidemia: Secondary | ICD-10-CM | POA: Diagnosis not present

## 2020-11-06 DIAGNOSIS — E1122 Type 2 diabetes mellitus with diabetic chronic kidney disease: Secondary | ICD-10-CM | POA: Diagnosis not present

## 2020-11-06 DIAGNOSIS — Z1389 Encounter for screening for other disorder: Secondary | ICD-10-CM | POA: Diagnosis not present

## 2020-11-06 DIAGNOSIS — Z23 Encounter for immunization: Secondary | ICD-10-CM | POA: Diagnosis not present

## 2020-11-06 DIAGNOSIS — M545 Low back pain, unspecified: Secondary | ICD-10-CM | POA: Diagnosis not present

## 2020-11-06 DIAGNOSIS — Z1212 Encounter for screening for malignant neoplasm of rectum: Secondary | ICD-10-CM | POA: Diagnosis not present

## 2020-11-06 DIAGNOSIS — E782 Mixed hyperlipidemia: Secondary | ICD-10-CM | POA: Diagnosis not present

## 2020-11-06 DIAGNOSIS — Z0001 Encounter for general adult medical examination with abnormal findings: Secondary | ICD-10-CM | POA: Diagnosis not present

## 2020-11-06 DIAGNOSIS — G4733 Obstructive sleep apnea (adult) (pediatric): Secondary | ICD-10-CM | POA: Diagnosis not present

## 2020-11-06 DIAGNOSIS — Z9189 Other specified personal risk factors, not elsewhere classified: Secondary | ICD-10-CM | POA: Diagnosis not present

## 2020-11-19 DIAGNOSIS — Z23 Encounter for immunization: Secondary | ICD-10-CM | POA: Diagnosis not present

## 2020-12-08 DIAGNOSIS — I10 Essential (primary) hypertension: Secondary | ICD-10-CM | POA: Diagnosis not present

## 2021-02-12 DIAGNOSIS — Z1231 Encounter for screening mammogram for malignant neoplasm of breast: Secondary | ICD-10-CM | POA: Diagnosis not present

## 2021-02-26 DIAGNOSIS — H524 Presbyopia: Secondary | ICD-10-CM | POA: Diagnosis not present

## 2021-02-26 DIAGNOSIS — H5203 Hypermetropia, bilateral: Secondary | ICD-10-CM | POA: Diagnosis not present

## 2021-02-26 DIAGNOSIS — E119 Type 2 diabetes mellitus without complications: Secondary | ICD-10-CM | POA: Diagnosis not present

## 2021-02-26 DIAGNOSIS — Z7984 Long term (current) use of oral hypoglycemic drugs: Secondary | ICD-10-CM | POA: Diagnosis not present

## 2021-02-26 DIAGNOSIS — H52223 Regular astigmatism, bilateral: Secondary | ICD-10-CM | POA: Diagnosis not present

## 2021-02-26 DIAGNOSIS — H25813 Combined forms of age-related cataract, bilateral: Secondary | ICD-10-CM | POA: Diagnosis not present

## 2021-03-06 DIAGNOSIS — R7301 Impaired fasting glucose: Secondary | ICD-10-CM | POA: Diagnosis not present

## 2021-03-06 DIAGNOSIS — I10 Essential (primary) hypertension: Secondary | ICD-10-CM | POA: Diagnosis not present

## 2021-03-06 DIAGNOSIS — N183 Chronic kidney disease, stage 3 unspecified: Secondary | ICD-10-CM | POA: Diagnosis not present

## 2021-03-06 DIAGNOSIS — E782 Mixed hyperlipidemia: Secondary | ICD-10-CM | POA: Diagnosis not present

## 2021-03-06 DIAGNOSIS — E1122 Type 2 diabetes mellitus with diabetic chronic kidney disease: Secondary | ICD-10-CM | POA: Diagnosis not present

## 2021-03-06 DIAGNOSIS — E7849 Other hyperlipidemia: Secondary | ICD-10-CM | POA: Diagnosis not present

## 2021-03-10 DIAGNOSIS — E7849 Other hyperlipidemia: Secondary | ICD-10-CM | POA: Diagnosis not present

## 2021-03-10 DIAGNOSIS — G4733 Obstructive sleep apnea (adult) (pediatric): Secondary | ICD-10-CM | POA: Diagnosis not present

## 2021-03-10 DIAGNOSIS — R4582 Worries: Secondary | ICD-10-CM | POA: Diagnosis not present

## 2021-03-10 DIAGNOSIS — I10 Essential (primary) hypertension: Secondary | ICD-10-CM | POA: Diagnosis not present

## 2021-03-10 DIAGNOSIS — E1122 Type 2 diabetes mellitus with diabetic chronic kidney disease: Secondary | ICD-10-CM | POA: Diagnosis not present

## 2021-03-10 DIAGNOSIS — Z79891 Long term (current) use of opiate analgesic: Secondary | ICD-10-CM | POA: Diagnosis not present

## 2021-04-20 DIAGNOSIS — H18413 Arcus senilis, bilateral: Secondary | ICD-10-CM | POA: Diagnosis not present

## 2021-04-20 DIAGNOSIS — H2513 Age-related nuclear cataract, bilateral: Secondary | ICD-10-CM | POA: Diagnosis not present

## 2021-04-20 DIAGNOSIS — H2511 Age-related nuclear cataract, right eye: Secondary | ICD-10-CM | POA: Diagnosis not present

## 2021-04-20 DIAGNOSIS — H25043 Posterior subcapsular polar age-related cataract, bilateral: Secondary | ICD-10-CM | POA: Diagnosis not present

## 2021-04-20 DIAGNOSIS — H25013 Cortical age-related cataract, bilateral: Secondary | ICD-10-CM | POA: Diagnosis not present

## 2021-05-18 DIAGNOSIS — M8589 Other specified disorders of bone density and structure, multiple sites: Secondary | ICD-10-CM | POA: Diagnosis not present

## 2021-05-18 DIAGNOSIS — M810 Age-related osteoporosis without current pathological fracture: Secondary | ICD-10-CM | POA: Diagnosis not present

## 2021-05-26 IMAGING — MG DIGITAL SCREENING BILAT W/ TOMO W/ CAD
6 of 10 series · 6 of 30 positions shown · non-contrast
Comparison: Previous exam(s).

CLINICAL DATA: Screening.

EXAM:
DIGITAL SCREENING BILATERAL MAMMOGRAM WITH TOMO AND CAD

[L CC synth-2D]
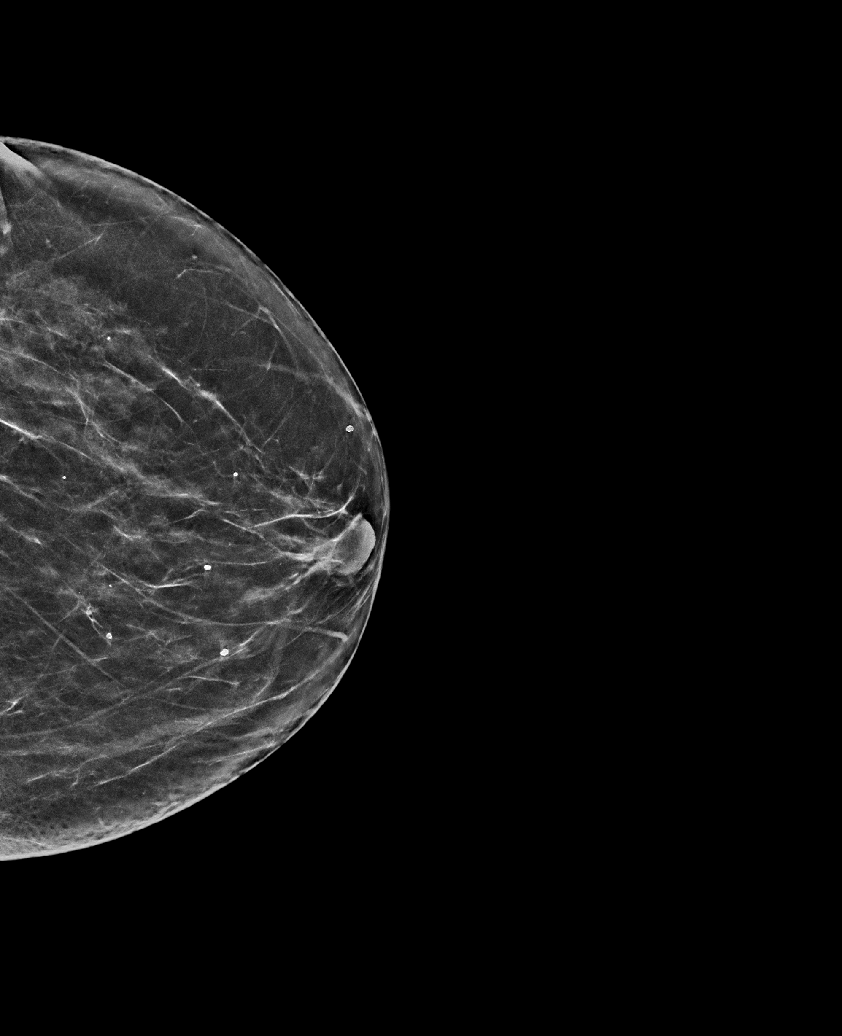

[R MLO synth-2D (1 of 2)]
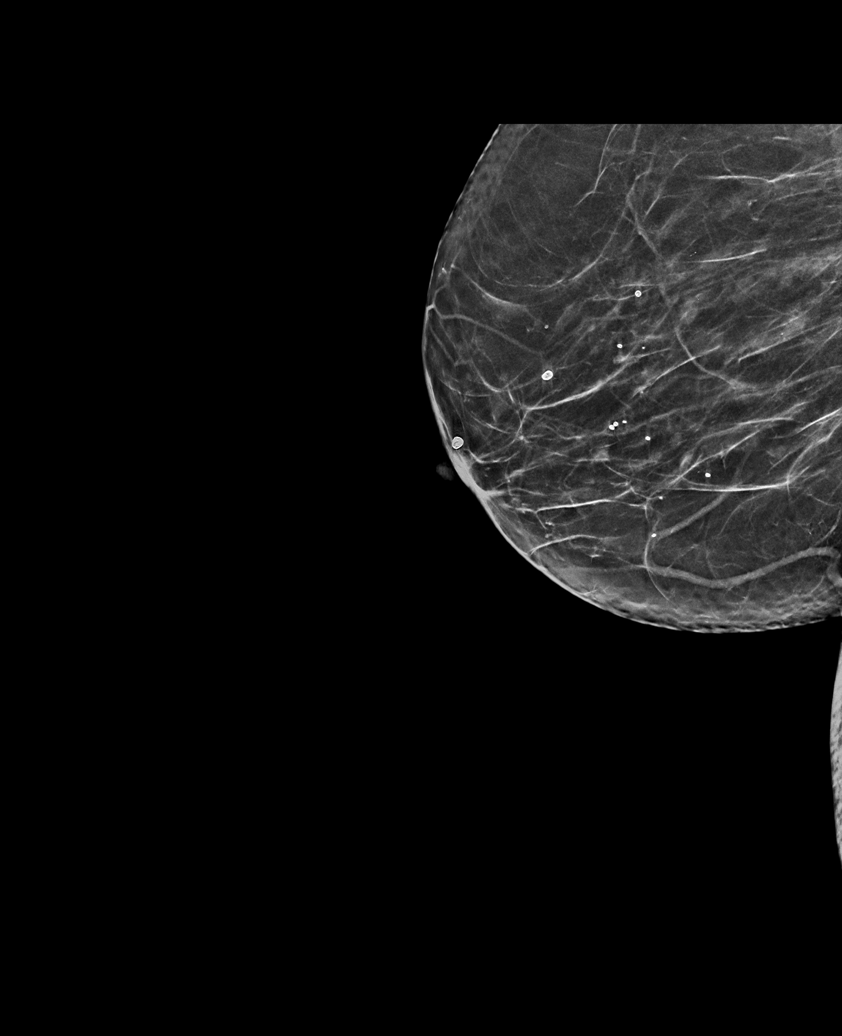

[L MLO synth-2D]
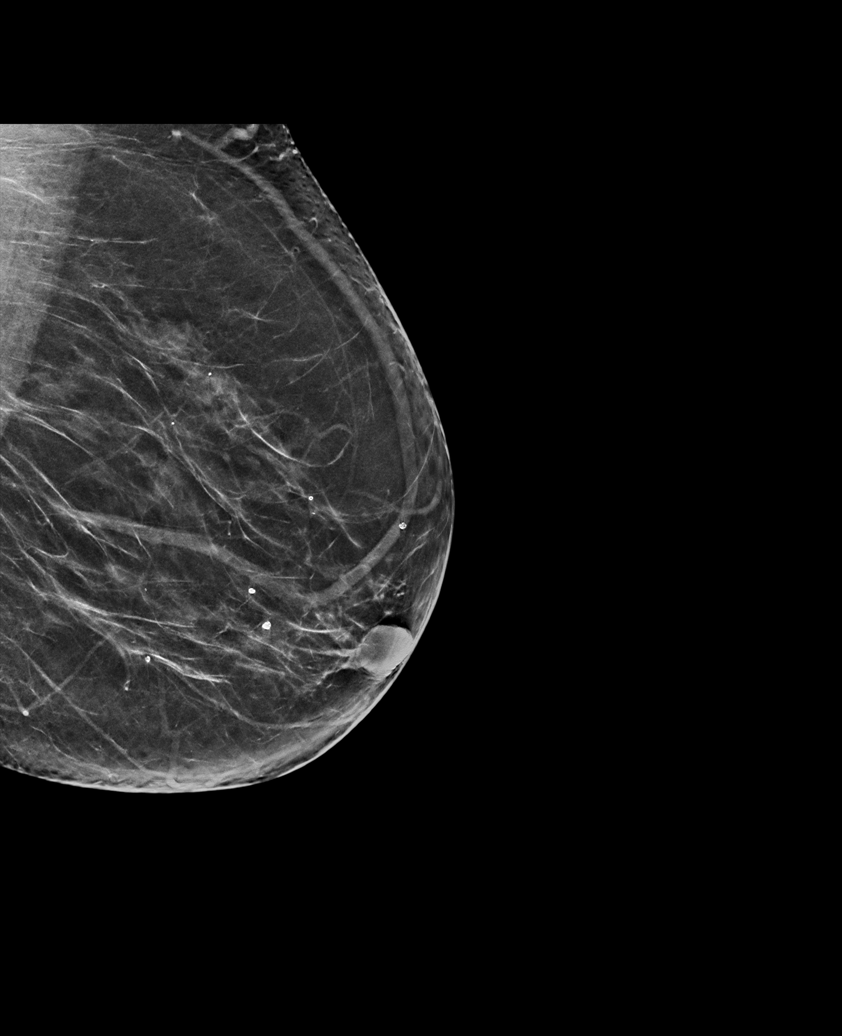

[R CC synth-2D]
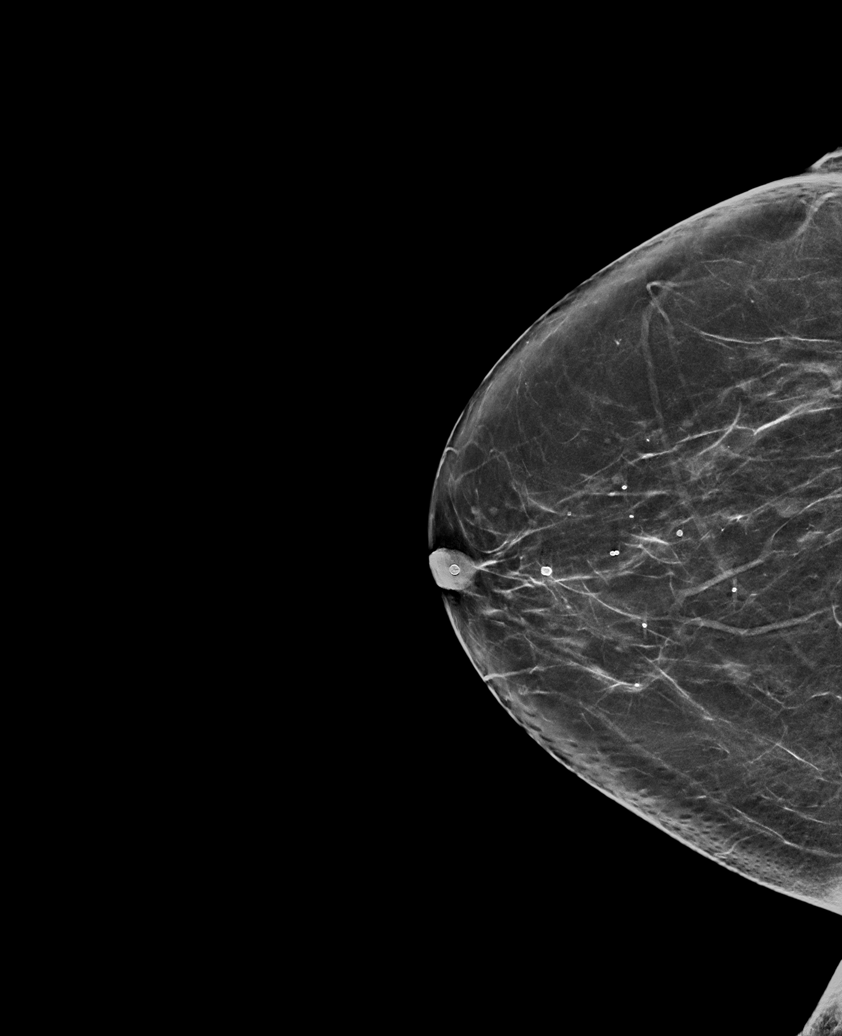

[R MLO synth-2D (2 of 2)]
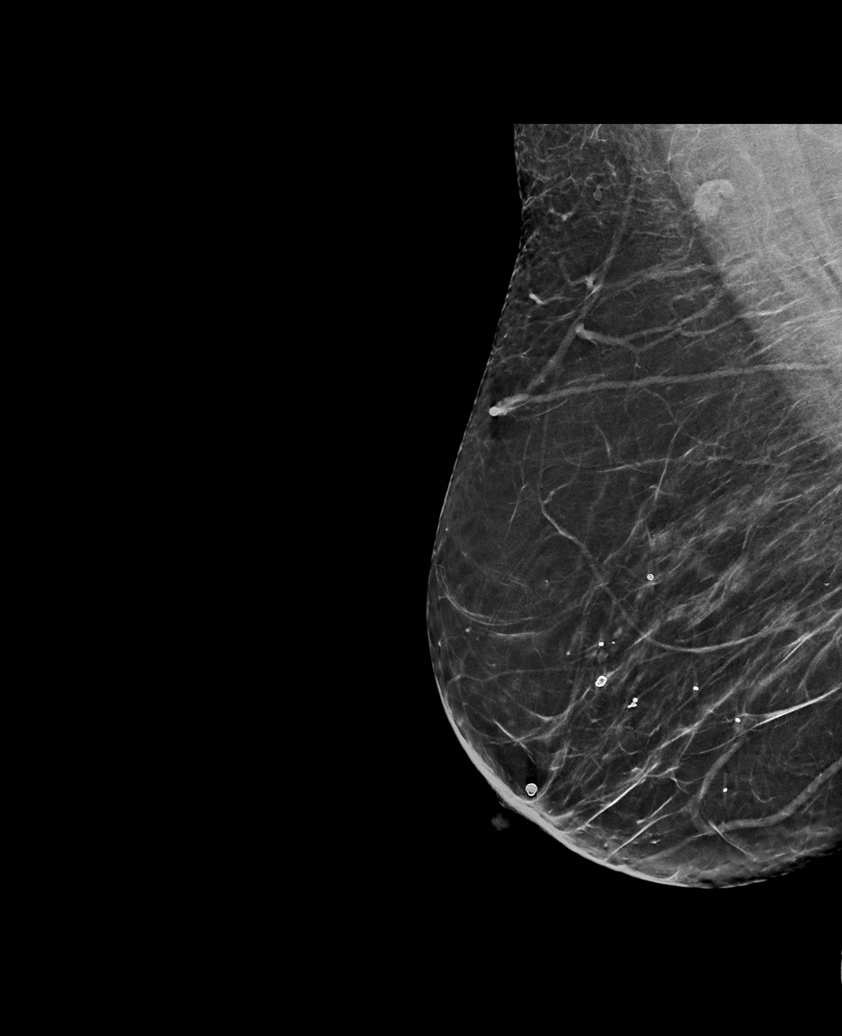

[R MLO tomo · tomo slice 31/61.0]
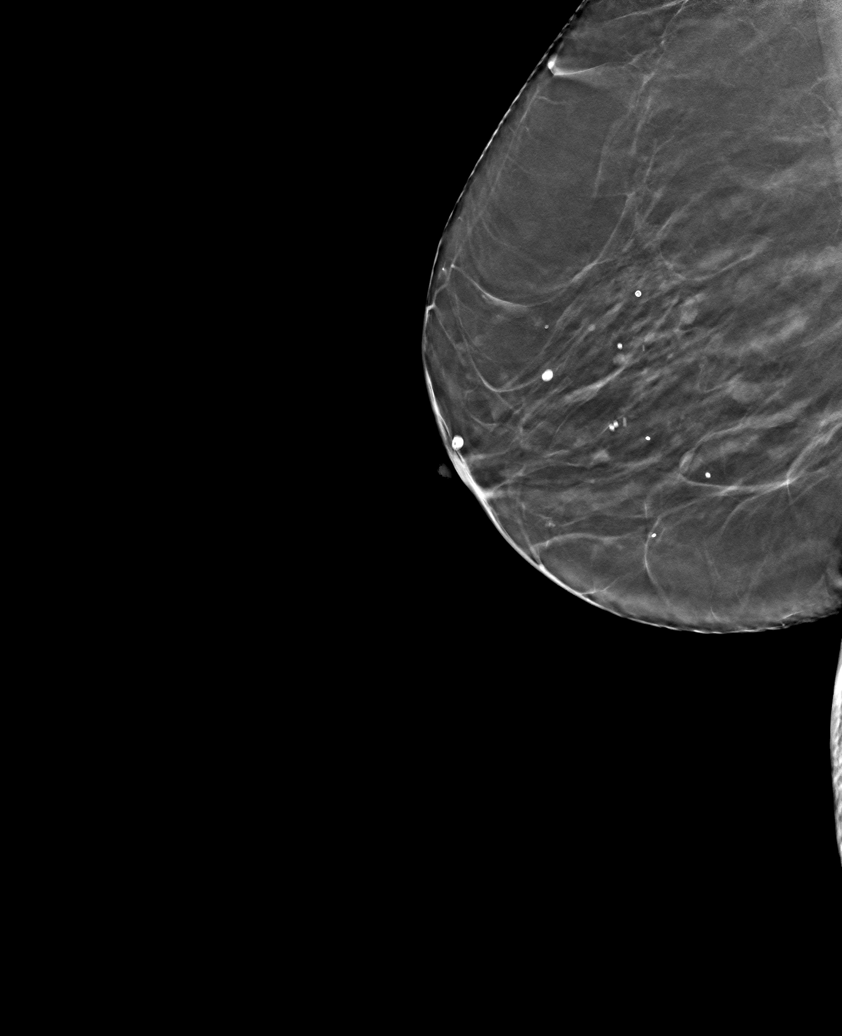

[6 of 30 positions shown; findings below may reference images not displayed]

ACR Breast Density Category b: There are scattered areas of
fibroglandular density.
FINDINGS: There are no findings suspicious for malignancy. Images were
processed with CAD.
IMPRESSION: No mammographic evidence of malignancy. A result letter of this
screening mammogram will be mailed directly to the patient.

RECOMMENDATION:
Screening mammogram in one year. (Code:[id number])

BI-RADS CATEGORY  1: Negative.

## 2021-09-23 DIAGNOSIS — N183 Chronic kidney disease, stage 3 unspecified: Secondary | ICD-10-CM | POA: Diagnosis not present

## 2021-09-23 DIAGNOSIS — W19XXXA Unspecified fall, initial encounter: Secondary | ICD-10-CM | POA: Diagnosis not present

## 2021-09-23 DIAGNOSIS — I10 Essential (primary) hypertension: Secondary | ICD-10-CM | POA: Diagnosis not present

## 2021-09-23 DIAGNOSIS — R739 Hyperglycemia, unspecified: Secondary | ICD-10-CM | POA: Diagnosis not present

## 2021-09-25 DIAGNOSIS — R42 Dizziness and giddiness: Secondary | ICD-10-CM | POA: Diagnosis not present

## 2021-09-25 DIAGNOSIS — R2689 Other abnormalities of gait and mobility: Secondary | ICD-10-CM | POA: Diagnosis not present

## 2021-11-10 DIAGNOSIS — E782 Mixed hyperlipidemia: Secondary | ICD-10-CM | POA: Diagnosis not present

## 2021-11-10 DIAGNOSIS — N183 Chronic kidney disease, stage 3 unspecified: Secondary | ICD-10-CM | POA: Diagnosis not present

## 2021-11-10 DIAGNOSIS — G4733 Obstructive sleep apnea (adult) (pediatric): Secondary | ICD-10-CM | POA: Diagnosis not present

## 2021-11-10 DIAGNOSIS — E1122 Type 2 diabetes mellitus with diabetic chronic kidney disease: Secondary | ICD-10-CM | POA: Diagnosis not present

## 2021-11-10 DIAGNOSIS — E7849 Other hyperlipidemia: Secondary | ICD-10-CM | POA: Diagnosis not present

## 2021-11-10 DIAGNOSIS — I10 Essential (primary) hypertension: Secondary | ICD-10-CM | POA: Diagnosis not present

## 2021-11-10 DIAGNOSIS — Z1329 Encounter for screening for other suspected endocrine disorder: Secondary | ICD-10-CM | POA: Diagnosis not present

## 2021-11-10 DIAGNOSIS — R7301 Impaired fasting glucose: Secondary | ICD-10-CM | POA: Diagnosis not present

## 2021-11-10 DIAGNOSIS — Z0001 Encounter for general adult medical examination with abnormal findings: Secondary | ICD-10-CM | POA: Diagnosis not present

## 2021-11-10 DIAGNOSIS — Z79891 Long term (current) use of opiate analgesic: Secondary | ICD-10-CM | POA: Diagnosis not present

## 2021-11-16 DIAGNOSIS — E7849 Other hyperlipidemia: Secondary | ICD-10-CM | POA: Diagnosis not present

## 2021-11-16 DIAGNOSIS — I10 Essential (primary) hypertension: Secondary | ICD-10-CM | POA: Diagnosis not present

## 2021-11-16 DIAGNOSIS — Z0001 Encounter for general adult medical examination with abnormal findings: Secondary | ICD-10-CM | POA: Diagnosis not present

## 2021-11-16 DIAGNOSIS — R4582 Worries: Secondary | ICD-10-CM | POA: Diagnosis not present

## 2021-11-16 DIAGNOSIS — E1122 Type 2 diabetes mellitus with diabetic chronic kidney disease: Secondary | ICD-10-CM | POA: Diagnosis not present

## 2021-11-16 DIAGNOSIS — M545 Low back pain, unspecified: Secondary | ICD-10-CM | POA: Diagnosis not present

## 2021-11-16 DIAGNOSIS — Z23 Encounter for immunization: Secondary | ICD-10-CM | POA: Diagnosis not present

## 2021-11-16 DIAGNOSIS — E8881 Metabolic syndrome: Secondary | ICD-10-CM | POA: Diagnosis not present

## 2021-11-16 DIAGNOSIS — G4733 Obstructive sleep apnea (adult) (pediatric): Secondary | ICD-10-CM | POA: Diagnosis not present

## 2021-11-16 DIAGNOSIS — Z79891 Long term (current) use of opiate analgesic: Secondary | ICD-10-CM | POA: Diagnosis not present

## 2021-12-21 DIAGNOSIS — R7301 Impaired fasting glucose: Secondary | ICD-10-CM | POA: Diagnosis not present

## 2021-12-21 DIAGNOSIS — N1831 Chronic kidney disease, stage 3a: Secondary | ICD-10-CM | POA: Diagnosis not present

## 2022-03-08 DIAGNOSIS — G4733 Obstructive sleep apnea (adult) (pediatric): Secondary | ICD-10-CM | POA: Diagnosis not present

## 2022-03-08 DIAGNOSIS — E1122 Type 2 diabetes mellitus with diabetic chronic kidney disease: Secondary | ICD-10-CM | POA: Diagnosis not present

## 2022-03-08 DIAGNOSIS — E7849 Other hyperlipidemia: Secondary | ICD-10-CM | POA: Diagnosis not present

## 2022-03-08 DIAGNOSIS — E782 Mixed hyperlipidemia: Secondary | ICD-10-CM | POA: Diagnosis not present

## 2022-03-08 DIAGNOSIS — I10 Essential (primary) hypertension: Secondary | ICD-10-CM | POA: Diagnosis not present

## 2022-03-08 DIAGNOSIS — R7301 Impaired fasting glucose: Secondary | ICD-10-CM | POA: Diagnosis not present

## 2022-03-15 DIAGNOSIS — I10 Essential (primary) hypertension: Secondary | ICD-10-CM | POA: Diagnosis not present

## 2022-03-15 DIAGNOSIS — M545 Low back pain, unspecified: Secondary | ICD-10-CM | POA: Diagnosis not present

## 2022-03-15 DIAGNOSIS — Z79891 Long term (current) use of opiate analgesic: Secondary | ICD-10-CM | POA: Diagnosis not present

## 2022-03-15 DIAGNOSIS — E1122 Type 2 diabetes mellitus with diabetic chronic kidney disease: Secondary | ICD-10-CM | POA: Diagnosis not present

## 2022-03-15 DIAGNOSIS — E7849 Other hyperlipidemia: Secondary | ICD-10-CM | POA: Diagnosis not present

## 2022-03-15 DIAGNOSIS — G4733 Obstructive sleep apnea (adult) (pediatric): Secondary | ICD-10-CM | POA: Diagnosis not present

## 2022-03-15 DIAGNOSIS — N1831 Chronic kidney disease, stage 3a: Secondary | ICD-10-CM | POA: Diagnosis not present

## 2022-03-15 DIAGNOSIS — E8881 Metabolic syndrome: Secondary | ICD-10-CM | POA: Diagnosis not present

## 2022-03-15 DIAGNOSIS — R4582 Worries: Secondary | ICD-10-CM | POA: Diagnosis not present

## 2022-07-07 DIAGNOSIS — I10 Essential (primary) hypertension: Secondary | ICD-10-CM | POA: Diagnosis not present

## 2022-07-07 DIAGNOSIS — N1831 Chronic kidney disease, stage 3a: Secondary | ICD-10-CM | POA: Diagnosis not present

## 2022-07-07 DIAGNOSIS — E1122 Type 2 diabetes mellitus with diabetic chronic kidney disease: Secondary | ICD-10-CM | POA: Diagnosis not present

## 2022-07-07 DIAGNOSIS — E039 Hypothyroidism, unspecified: Secondary | ICD-10-CM | POA: Diagnosis not present

## 2022-07-07 DIAGNOSIS — E7849 Other hyperlipidemia: Secondary | ICD-10-CM | POA: Diagnosis not present

## 2022-07-14 DIAGNOSIS — Z9189 Other specified personal risk factors, not elsewhere classified: Secondary | ICD-10-CM | POA: Diagnosis not present

## 2022-07-14 DIAGNOSIS — E1122 Type 2 diabetes mellitus with diabetic chronic kidney disease: Secondary | ICD-10-CM | POA: Diagnosis not present

## 2022-07-14 DIAGNOSIS — E8881 Metabolic syndrome: Secondary | ICD-10-CM | POA: Diagnosis not present

## 2022-07-14 DIAGNOSIS — N1831 Chronic kidney disease, stage 3a: Secondary | ICD-10-CM | POA: Diagnosis not present

## 2022-07-14 DIAGNOSIS — E7849 Other hyperlipidemia: Secondary | ICD-10-CM | POA: Diagnosis not present

## 2022-07-14 DIAGNOSIS — I10 Essential (primary) hypertension: Secondary | ICD-10-CM | POA: Diagnosis not present

## 2022-07-14 DIAGNOSIS — Z79891 Long term (current) use of opiate analgesic: Secondary | ICD-10-CM | POA: Diagnosis not present

## 2022-07-14 DIAGNOSIS — M545 Low back pain, unspecified: Secondary | ICD-10-CM | POA: Diagnosis not present

## 2022-07-14 DIAGNOSIS — G4733 Obstructive sleep apnea (adult) (pediatric): Secondary | ICD-10-CM | POA: Diagnosis not present

## 2022-07-14 DIAGNOSIS — Z23 Encounter for immunization: Secondary | ICD-10-CM | POA: Diagnosis not present

## 2022-10-06 DIAGNOSIS — Z1211 Encounter for screening for malignant neoplasm of colon: Secondary | ICD-10-CM | POA: Diagnosis not present

## 2022-10-13 LAB — COLOGUARD: COLOGUARD: NEGATIVE

## 2022-10-22 DIAGNOSIS — E7849 Other hyperlipidemia: Secondary | ICD-10-CM | POA: Diagnosis not present

## 2022-10-22 DIAGNOSIS — R7301 Impaired fasting glucose: Secondary | ICD-10-CM | POA: Diagnosis not present

## 2022-10-22 DIAGNOSIS — E1122 Type 2 diabetes mellitus with diabetic chronic kidney disease: Secondary | ICD-10-CM | POA: Diagnosis not present

## 2022-10-22 DIAGNOSIS — I10 Essential (primary) hypertension: Secondary | ICD-10-CM | POA: Diagnosis not present

## 2022-10-22 DIAGNOSIS — N1831 Chronic kidney disease, stage 3a: Secondary | ICD-10-CM | POA: Diagnosis not present

## 2022-10-29 DIAGNOSIS — M545 Low back pain, unspecified: Secondary | ICD-10-CM | POA: Diagnosis not present

## 2022-10-29 DIAGNOSIS — Z23 Encounter for immunization: Secondary | ICD-10-CM | POA: Diagnosis not present

## 2022-10-29 DIAGNOSIS — R4582 Worries: Secondary | ICD-10-CM | POA: Diagnosis not present

## 2022-10-29 DIAGNOSIS — G4733 Obstructive sleep apnea (adult) (pediatric): Secondary | ICD-10-CM | POA: Diagnosis not present

## 2022-10-29 DIAGNOSIS — E1122 Type 2 diabetes mellitus with diabetic chronic kidney disease: Secondary | ICD-10-CM | POA: Diagnosis not present

## 2022-10-29 DIAGNOSIS — I10 Essential (primary) hypertension: Secondary | ICD-10-CM | POA: Diagnosis not present

## 2022-10-29 DIAGNOSIS — Z79891 Long term (current) use of opiate analgesic: Secondary | ICD-10-CM | POA: Diagnosis not present

## 2022-10-29 DIAGNOSIS — E7849 Other hyperlipidemia: Secondary | ICD-10-CM | POA: Diagnosis not present

## 2022-11-24 DIAGNOSIS — N1831 Chronic kidney disease, stage 3a: Secondary | ICD-10-CM | POA: Diagnosis not present

## 2022-11-24 DIAGNOSIS — E1122 Type 2 diabetes mellitus with diabetic chronic kidney disease: Secondary | ICD-10-CM | POA: Diagnosis not present

## 2022-11-24 DIAGNOSIS — E7849 Other hyperlipidemia: Secondary | ICD-10-CM | POA: Diagnosis not present

## 2022-11-24 DIAGNOSIS — E039 Hypothyroidism, unspecified: Secondary | ICD-10-CM | POA: Diagnosis not present

## 2022-11-24 DIAGNOSIS — E559 Vitamin D deficiency, unspecified: Secondary | ICD-10-CM | POA: Diagnosis not present

## 2022-11-30 DIAGNOSIS — Z1231 Encounter for screening mammogram for malignant neoplasm of breast: Secondary | ICD-10-CM | POA: Diagnosis not present

## 2022-12-01 DIAGNOSIS — Z23 Encounter for immunization: Secondary | ICD-10-CM | POA: Diagnosis not present

## 2022-12-01 DIAGNOSIS — E559 Vitamin D deficiency, unspecified: Secondary | ICD-10-CM | POA: Diagnosis not present

## 2022-12-01 DIAGNOSIS — Z0001 Encounter for general adult medical examination with abnormal findings: Secondary | ICD-10-CM | POA: Diagnosis not present

## 2022-12-01 DIAGNOSIS — Z1389 Encounter for screening for other disorder: Secondary | ICD-10-CM | POA: Diagnosis not present

## 2022-12-01 DIAGNOSIS — E1122 Type 2 diabetes mellitus with diabetic chronic kidney disease: Secondary | ICD-10-CM | POA: Diagnosis not present

## 2022-12-01 DIAGNOSIS — Z79891 Long term (current) use of opiate analgesic: Secondary | ICD-10-CM | POA: Diagnosis not present

## 2022-12-01 DIAGNOSIS — E7849 Other hyperlipidemia: Secondary | ICD-10-CM | POA: Diagnosis not present

## 2022-12-01 DIAGNOSIS — M25522 Pain in left elbow: Secondary | ICD-10-CM | POA: Diagnosis not present

## 2022-12-01 DIAGNOSIS — I10 Essential (primary) hypertension: Secondary | ICD-10-CM | POA: Diagnosis not present

## 2023-08-03 DIAGNOSIS — Z1329 Encounter for screening for other suspected endocrine disorder: Secondary | ICD-10-CM | POA: Diagnosis not present

## 2023-08-03 DIAGNOSIS — E1122 Type 2 diabetes mellitus with diabetic chronic kidney disease: Secondary | ICD-10-CM | POA: Diagnosis not present

## 2023-08-03 DIAGNOSIS — I10 Essential (primary) hypertension: Secondary | ICD-10-CM | POA: Diagnosis not present

## 2023-08-03 DIAGNOSIS — R42 Dizziness and giddiness: Secondary | ICD-10-CM | POA: Diagnosis not present

## 2023-08-10 DIAGNOSIS — E782 Mixed hyperlipidemia: Secondary | ICD-10-CM | POA: Diagnosis not present

## 2023-08-10 DIAGNOSIS — E1122 Type 2 diabetes mellitus with diabetic chronic kidney disease: Secondary | ICD-10-CM | POA: Diagnosis not present

## 2023-08-10 DIAGNOSIS — I10 Essential (primary) hypertension: Secondary | ICD-10-CM | POA: Diagnosis not present

## 2023-08-10 DIAGNOSIS — G4733 Obstructive sleep apnea (adult) (pediatric): Secondary | ICD-10-CM | POA: Diagnosis not present

## 2023-11-07 DIAGNOSIS — E1122 Type 2 diabetes mellitus with diabetic chronic kidney disease: Secondary | ICD-10-CM | POA: Diagnosis not present

## 2023-11-07 DIAGNOSIS — E7849 Other hyperlipidemia: Secondary | ICD-10-CM | POA: Diagnosis not present

## 2023-11-07 DIAGNOSIS — N1831 Chronic kidney disease, stage 3a: Secondary | ICD-10-CM | POA: Diagnosis not present

## 2023-11-07 DIAGNOSIS — Z130 Encounter for screening for diseases of the blood and blood-forming organs and certain disorders involving the immune mechanism: Secondary | ICD-10-CM | POA: Diagnosis not present

## 2023-11-07 DIAGNOSIS — E039 Hypothyroidism, unspecified: Secondary | ICD-10-CM | POA: Diagnosis not present

## 2023-11-14 DIAGNOSIS — N1831 Chronic kidney disease, stage 3a: Secondary | ICD-10-CM | POA: Diagnosis not present

## 2023-11-14 DIAGNOSIS — G4733 Obstructive sleep apnea (adult) (pediatric): Secondary | ICD-10-CM | POA: Diagnosis not present

## 2023-11-14 DIAGNOSIS — E1122 Type 2 diabetes mellitus with diabetic chronic kidney disease: Secondary | ICD-10-CM | POA: Diagnosis not present

## 2023-11-14 DIAGNOSIS — Z0001 Encounter for general adult medical examination with abnormal findings: Secondary | ICD-10-CM | POA: Diagnosis not present

## 2023-11-14 DIAGNOSIS — M545 Low back pain, unspecified: Secondary | ICD-10-CM | POA: Diagnosis not present

## 2023-11-14 DIAGNOSIS — I10 Essential (primary) hypertension: Secondary | ICD-10-CM | POA: Diagnosis not present

## 2023-11-14 DIAGNOSIS — Z23 Encounter for immunization: Secondary | ICD-10-CM | POA: Diagnosis not present

## 2023-12-04 ENCOUNTER — Encounter (HOSPITAL_COMMUNITY): Payer: Self-pay

## 2023-12-04 ENCOUNTER — Emergency Department (HOSPITAL_COMMUNITY)

## 2023-12-04 ENCOUNTER — Other Ambulatory Visit: Payer: Self-pay

## 2023-12-04 ENCOUNTER — Emergency Department (HOSPITAL_COMMUNITY)
Admission: EM | Admit: 2023-12-04 | Discharge: 2023-12-04 | Disposition: A | Discharge status: Home / Self Care | Attending: Emergency Medicine | Admitting: Emergency Medicine

## 2023-12-04 DIAGNOSIS — Y92410 Unspecified street and highway as the place of occurrence of the external cause: Secondary | ICD-10-CM | POA: Diagnosis not present

## 2023-12-04 DIAGNOSIS — E119 Type 2 diabetes mellitus without complications: Secondary | ICD-10-CM | POA: Insufficient documentation

## 2023-12-04 DIAGNOSIS — S20211A Contusion of right front wall of thorax, initial encounter: Secondary | ICD-10-CM | POA: Insufficient documentation

## 2023-12-04 DIAGNOSIS — R918 Other nonspecific abnormal finding of lung field: Secondary | ICD-10-CM | POA: Insufficient documentation

## 2023-12-04 DIAGNOSIS — S299XXA Unspecified injury of thorax, initial encounter: Secondary | ICD-10-CM | POA: Diagnosis present

## 2023-12-04 DIAGNOSIS — Z79899 Other long term (current) drug therapy: Secondary | ICD-10-CM | POA: Insufficient documentation

## 2023-12-04 DIAGNOSIS — I10 Essential (primary) hypertension: Secondary | ICD-10-CM | POA: Insufficient documentation

## 2023-12-04 MED ORDER — ACETAMINOPHEN 500 MG PO TABS
1000.0000 mg | ORAL_TABLET | Freq: Once | ORAL | Status: AC
  Administered 2023-12-04: 1000 mg via ORAL
  Filled 2023-12-04: qty 2

## 2023-12-04 MED ORDER — OXYCODONE HCL 5 MG PO TABS
5.0000 mg | ORAL_TABLET | Freq: Once | ORAL | Status: DC
  Filled 2023-12-04: qty 1

## 2023-12-04 MED ORDER — LIDOCAINE 5 % EX PTCH
1.0000 | MEDICATED_PATCH | CUTANEOUS | Status: DC
  Administered 2023-12-04: 1 via TRANSDERMAL
  Filled 2023-12-04: qty 1

## 2023-12-04 MED ORDER — HYDROCODONE-ACETAMINOPHEN 7.5-325 MG PO TABS: 1.0000 | ORAL_TABLET | Freq: Four times a day (QID) | ORAL | 0 refills | Status: AC | PRN
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
## 2023-12-04 NOTE — ED Notes (Signed)
 Provided pt w/ education on how to use incentive spirometer and how often. Pt verbalized understanding.
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
